# Patient Record
Sex: Female | Born: 1990 | Race: White | Marital: Single | Smoking: Never smoker
Health system: Southern US, Community
[De-identification: ages and names within clinical notes are randomized; demographics above are authoritative.]

---

## 2013-02-03 ENCOUNTER — Ambulatory Visit (INDEPENDENT_AMBULATORY_CARE_PROVIDER_SITE_OTHER): Payer: 59 | Admitting: Physician Assistant

## 2013-02-03 VITALS — BP 112/68 | HR 104 | Temp 98.0°F | Resp 16 | Ht 62.0 in | Wt 208.0 lb

## 2013-02-03 DIAGNOSIS — J069 Acute upper respiratory infection, unspecified: Secondary | ICD-10-CM

## 2013-02-03 DIAGNOSIS — J029 Acute pharyngitis, unspecified: Secondary | ICD-10-CM

## 2013-02-03 LAB — POCT RAPID STREP A (OFFICE): RAPID STREP A SCREEN: NEGATIVE

## 2013-02-03 MED ORDER — GUAIFENESIN ER 1200 MG PO TB12: 1.0000 | ORAL_TABLET | Freq: Two times a day (BID) | ORAL | Status: AC

## 2013-02-03 MED ORDER — IPRATROPIUM BROMIDE 0.06 % NA SOLN: 2.0000 | Freq: Three times a day (TID) | NASAL | Status: AC

## 2013-02-03 NOTE — Progress Notes (Signed)
   Subjective:    Patient ID: Tammy Freeman, female    DOB: 12-31-1990, 23 y.o.   MRN: [id number]  HPI Pt presents to clinic with 24h h/o cold symptoms.  She is worried about strep throat because she gets it a lot and she was exposed about 3-4 days ago.  She has terrible congestion with minimal rhinorrhea but a lot of PND.  She has a dry cough that is rare.  She is having no problems with her breathing.  OTC meds - cold preps Exposed to strep  Flu vaccine - no  Review of Systems  Constitutional: Negative for fever and chills.  HENT: Positive for congestion, postnasal drip and sore throat. Negative for rhinorrhea.   Respiratory: Positive for cough (dry).        Childhood h/o asthma now sometimes with problems with illness.  Gastrointestinal: Negative for nausea, vomiting and diarrhea.  Musculoskeletal: Negative for myalgias.  Neurological: Positive for headaches.       Objective:   Physical Exam  Vitals reviewed. Constitutional: She is oriented to person, place, and time. She appears well-developed and well-nourished.  HENT:  Head: Normocephalic and atraumatic.  Right Ear: Hearing, tympanic membrane, external ear and ear canal normal.  Left Ear: Hearing, tympanic membrane, external ear and ear canal normal.  Nose: Mucosal edema (very swollen turbinates touching her septum) present.  Mouth/Throat: Uvula is midline, oropharynx is clear and moist and mucous membranes are normal.  Eyes: Conjunctivae are normal.  Neck: Normal range of motion.  Cardiovascular: Normal rate, regular rhythm and normal heart sounds.   No murmur heard. Pulmonary/Chest: Effort normal and breath sounds normal. She has no wheezes.  Neurological: She is alert and oriented to person, place, and time.  Skin: Skin is warm and dry.  Psychiatric: She has a normal mood and affect. Her behavior is normal. Judgment and thought content normal.   Results for orders placed in visit on 02/03/13  POCT RAPID STREP A (OFFICE)       Result Value Range   Rapid Strep A Screen Negative  Negative       Assessment & Plan:  URI (upper respiratory infection) - Plan: Guaifenesin (MUCINEX MAXIMUM STRENGTH) 1200 MG TB12, ipratropium (ATROVENT) 0.06 % nasal spray  Sore throat - Plan: POCT rapid strep A  Symptomatic treatment d/w patient.    Benny LennertSarah Weber PA-C 02/03/2013 7:59 PM

## 2013-02-03 NOTE — Patient Instructions (Signed)
Please push fluids.  Tylenol and Motrin for fever and body aches.    

## 2018-10-25 NOTE — Progress Notes (Signed)
Office Visit Note  Patient: Tammy Freeman             Date of Birth: 01-Mar-1990           MRN: [id number]             PCP: Lysbeth Penner, MD Referring: Lysbeth Penner, MD Visit Date: 11/08/2018 Occupation: CNA, Environmental health practitioner  Subjective:  Lower back pain, positive ANA.   History of Present Illness: Tammy Freeman is a 28 y.o. female seen in consultation per request of her PCP.  According to patient about 6 years ago she woke up and was trying to put her pants on when she had sudden pain and numbness in her left lower extremity.  She states her symptoms persisted and she went to see her PCP who felt it was muscle spasm but the pain never did resolve.  She states she lived with those symptoms.  About 5 years ago she was seeing a back specialist in [place] who gave her back injections per patient and the relief in her symptoms for about a month.  Then about  4 years ago she went to the urgent care where she had x-rays of her lumbar spine and she was told they were normal.  She was given a steroid shots and also some medications which did not help.  She was recently seen by her PCP in August and had lab work which showed positive ANA and she was referred to me.  Patient denies discomfort in any other joints.  She states the pain is mostly in her back which makes it uncomfortable for her to sit and also she has difficulty climbing stairs and getting up from the chair.  She states the pain radiates into her bilateral hips and her left lower extremity.  Her left leg and her foot remains numb.  She denies any history of oral ulcers, nasal ulcers, malar rash, photosensitivity, chest pain palpitations, Raynaud's phenomenon.  There is no family history of autoimmune disease.  Activities of Daily Living:  Patient reports morning stiffness for 1 hour.   Patient Reports nocturnal pain.  Difficulty dressing/grooming: Denies Difficulty climbing stairs: Reports Difficulty getting out of chair: Reports  Difficulty using hands for taps, buttons, cutlery, and/or writing: Denies  Review of Systems  Constitutional: Negative for fatigue, night sweats, weight gain and weight loss.  HENT: Negative for mouth sores, trouble swallowing, trouble swallowing, mouth dryness and nose dryness.   Eyes: Negative for pain, redness, itching, visual disturbance and dryness.  Respiratory: Negative for cough, shortness of breath, wheezing and difficulty breathing.   Cardiovascular: Negative for chest pain, palpitations, hypertension, irregular heartbeat and swelling in legs/feet.  Gastrointestinal: Negative for blood in stool, constipation and diarrhea.  Endocrine: Negative for increased urination.  Genitourinary: Negative for difficulty urinating, painful urination and vaginal dryness.  Musculoskeletal: Positive for arthralgias, joint pain and morning stiffness. Negative for joint swelling, myalgias, muscle weakness, muscle tenderness and myalgias.  Skin: Negative for color change, rash, hair loss, skin tightness, ulcers and sensitivity to sunlight.  Allergic/Immunologic: Negative for susceptible to infections.  Neurological: Positive for numbness. Negative for dizziness, light-headedness, headaches, memory loss, night sweats and weakness.  Hematological: Negative for bruising/bleeding tendency and swollen glands.  Psychiatric/Behavioral: Positive for sleep disturbance. Negative for depressed mood and confusion. The patient is not nervous/anxious.     PMFS History:  There are no active problems to display for this patient.   History reviewed. No pertinent past medical history.  Family  History  Problem Relation Age of Onset  . Healthy Mother   . Hypertension Father   . Hypertension Paternal Grandmother   . Hypertension Paternal Grandfather   . Healthy Brother    History reviewed. No pertinent surgical history. Social History   Social History Narrative  . Not on file    There is no immunization  history on file for this patient.   Objective: Vital Signs: BP 113/79 (BP Location: Right Arm, Patient Position: Sitting, Cuff Size: Normal)   Pulse (!) 55   Resp 14   Ht 5' 2.25" (1.581 m)   Wt 197 lb 12.8 oz (89.7 kg)   BMI 35.89 kg/m    Physical Exam Vitals signs and nursing note reviewed.  Constitutional:      Appearance: She is well-developed.  HENT:     Head: Normocephalic and atraumatic.  Eyes:     Conjunctiva/sclera: Conjunctivae normal.  Neck:     Musculoskeletal: Normal range of motion.  Cardiovascular:     Rate and Rhythm: Normal rate and regular rhythm.     Heart sounds: Normal heart sounds.  Pulmonary:     Effort: Pulmonary effort is normal.     Breath sounds: Normal breath sounds.  Abdominal:     General: Bowel sounds are normal.     Palpations: Abdomen is soft.  Lymphadenopathy:     Cervical: No cervical adenopathy.  Skin:    General: Skin is warm and dry.     Capillary Refill: Capillary refill takes less than 2 seconds.  Neurological:     Mental Status: She is alert and oriented to person, place, and time.  Psychiatric:        Behavior: Behavior normal.      Musculoskeletal Exam: C-spine was in good range of motion.  She has difficulty with forward flexion or extension due to severe lower back pain.  She has some tenderness over SI joints.  Mild tenderness was noted over trochanteric area.  Shoulder joints, elbow joints, wrist joints, MCPs PIPs and DIPs with good range of motion with no synovitis.  Hip joints, knee joints, ankles, MTPs and PIPs with good range of motion with no synovitis.  She has no Achilles tendinitis or plantar fasciitis.  CDAI Exam: CDAI Score: - Patient Global: -; Provider Global: - Swollen: -; Tender: - Joint Exam   No joint exam has been documented for this visit   There is currently no information documented on the homunculus. Go to the Rheumatology activity and complete the homunculus joint exam.  Investigation: No  additional findings.  Imaging: Xr Lumbar Spine 2-3 Views  Result Date: 11/08/2018 No disc space narrowing was noted.  No facet joint narrowing was noted.  No syndesmophytes were noted. Impression: Unremarkable x-ray of the lumbar spine.  Xr Pelvis 1-2 Views  Result Date: 11/08/2018 No SI joint to sclerosis or narrowing was noted. Impression: Unremarkable x-ray of the SI joints.   Recent Labs: No results found for: WBC, HGB, PLT, NA, K, CL, CO2, GLUCOSE, BUN, CREATININE, BILITOT, ALKPHOS, AST, ALT, PROT, ALBUMIN, CALCIUM, GFRAA, QFTBGOLD, QFTBGOLDPLUS   09/13/18:ANA 1:40 speckled, ENA-, Anti-tpo ab-, hep panel-, HLAB27-, RF<10, CCP 4, CRP 1  Speciality Comments: No specialty comments available.  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  She has had  persistent pain since then.  She has had prednisone taper and steroid injections to her lumbar spine several years ago.  She describes the pain is constant and radiates to her left lower extremity.  She also experiences numbness in her left foot.  She has some tenderness over SI joints.  X-ray of the lumbar spine and SI joints were unremarkable today.  I will obtain MRI of her lumbar spine due to chronic pain and radiculopathy.  She is in constant pain.  Her CRP was normal and HLA-B27 was negative.  Have given her some back exercises.  I will also refer her to physical therapy.  She has been taking gabapentin which has not helped her.  Positive ANA-is borderline positive and she has no clinical features of autoimmune disease.  No further testing is needed.  Orders: Orders Placed This Encounter  Procedures  . XR Lumbar Spine 2-3 Views  . XR Pelvis 1-2 Views  . MR LUMBAR SPINE WO CONTRAST  . Ambulatory referral to  Physical Therapy   No orders of the defined types were placed in this encounter.   Face-to-face time spent with patient was 45 minutes. Greater than 50% of time was spent in counseling and coordination of care.  Follow-Up Instructions: Return for LBP.   Bo Merino, MD  Note - This record has been created using Editor, commissioning.  Chart creation errors have been sought, but may not always  have been located. Such creation errors do not reflect on  the standard of medical care.

## 2018-11-08 ENCOUNTER — Ambulatory Visit: Payer: No Typology Code available for payment source | Admitting: Rheumatology

## 2018-11-08 ENCOUNTER — Ambulatory Visit (INDEPENDENT_AMBULATORY_CARE_PROVIDER_SITE_OTHER): Payer: No Typology Code available for payment source

## 2018-11-08 ENCOUNTER — Other Ambulatory Visit: Payer: Self-pay

## 2018-11-08 ENCOUNTER — Encounter: Payer: Self-pay | Admitting: Rheumatology

## 2018-11-08 VITALS — BP 113/79 | HR 55 | Resp 14 | Ht 62.25 in | Wt 197.8 lb

## 2018-11-08 DIAGNOSIS — M5442 Lumbago with sciatica, left side: Secondary | ICD-10-CM

## 2018-11-08 DIAGNOSIS — R768 Other specified abnormal immunological findings in serum: Secondary | ICD-10-CM | POA: Diagnosis not present

## 2018-11-08 DIAGNOSIS — G8929 Other chronic pain: Secondary | ICD-10-CM

## 2018-11-08 NOTE — Patient Instructions (Signed)

## 2018-11-17 NOTE — Progress Notes (Signed)
Office Visit Note  Patient: Tammy Freeman             Date of Birth: 1990/04/26           MRN: [id number]             PCP: Lysbeth Penner, MD Referring: Lysbeth Penner, MD Visit Date: 12/01/2018 Occupation: @GUAROCC @  Subjective:  No chief complaint on file.   History of Present Illness: Tammy Freeman is a 28 y.o. female with a history of lower back pain.  She continues to have left lower extremity radiculopathy.  She states she has been experiencing numbness on the lateral side of her leg and also her left foot states completely numb.  She is unable to sit or stand for prolonged time.  Activities of Daily Living:  Patient reports morning stiffness for 1 hour.   Patient Reports nocturnal pain.  Difficulty dressing/grooming: Denies Difficulty climbing stairs: Reports Difficulty getting out of chair: Reports Difficulty using hands for taps, buttons, cutlery, and/or writing: Denies  Review of Systems  Constitutional: Negative for fatigue.  HENT: Negative for mouth sores, mouth dryness and nose dryness.   Eyes: Negative for itching and dryness.  Respiratory: Negative for shortness of breath and difficulty breathing.   Cardiovascular: Negative for chest pain and palpitations.  Gastrointestinal: Negative for abdominal pain, blood in stool, constipation and diarrhea.  Endocrine: Negative for increased urination.  Genitourinary: Negative for difficulty urinating and painful urination.  Musculoskeletal: Positive for arthralgias, joint pain and morning stiffness. Negative for joint swelling.  Skin: Negative for rash, hair loss and redness.  Allergic/Immunologic: Negative for susceptible to infections.  Neurological: Positive for numbness. Negative for dizziness, light-headedness, headaches, memory loss and weakness.  Hematological: Negative for bruising/bleeding tendency.  Psychiatric/Behavioral: Positive for sleep disturbance. Negative for depressed mood and confusion. The patient is not  nervous/anxious.     PMFS History:  There are no active problems to display for this patient.   History reviewed. No pertinent past medical history.  Family History  Problem Relation Age of Onset  . Healthy Mother   . Hypertension Father   . Hypertension Paternal Grandmother   . Hypertension Paternal Grandfather   . Healthy Brother    History reviewed. No pertinent surgical history. Social History   Social History Narrative  . Not on file    There is no immunization history on file for this patient.   Objective: Vital Signs: BP 108/70 (BP Location: Left Arm, Patient Position: Sitting, Cuff Size: Normal)   Pulse 60   Resp 12   Ht 5' 3"  (1.6 m)   Wt 199 lb 12.8 oz (90.6 kg)   BMI 35.39 kg/m    Physical Exam Vitals signs and nursing note reviewed.  Constitutional:      Appearance: She is well-developed.  HENT:     Head: Normocephalic and atraumatic.  Eyes:     Conjunctiva/sclera: Conjunctivae normal.  Neck:     Musculoskeletal: Normal range of motion.  Cardiovascular:     Rate and Rhythm: Normal rate and regular rhythm.     Heart sounds: Normal heart sounds.  Pulmonary:     Effort: Pulmonary effort is normal.     Breath sounds: Normal breath sounds.  Abdominal:     General: Bowel sounds are normal.     Palpations: Abdomen is soft.  Lymphadenopathy:     Cervical: No cervical adenopathy.  Skin:    General: Skin is warm and dry.     Capillary  Refill: Capillary refill takes less than 2 seconds.  Neurological:     Mental Status: She is alert and oriented to person, place, and time.  Psychiatric:        Behavior: Behavior normal.      Musculoskeletal Exam: C-spine was in good range of motion.  She had discomfort range of motion lumbar spine.  No SI joint tenderness was noted.  Shoulder joints, elbow joints, wrist joints, MCPs PIPs and DIPs with good range of motion with no synovitis.  Hip joints, knee joints, ankles, MTPs and PIPs with good range of motion with  no synovitis.  Straight leg raise test was positive on the left side. CDAI Exam: CDAI Score: - Patient Global: -; Provider Global: - Swollen: -; Tender: - Joint Exam   No joint exam has been documented for this visit   There is currently no information documented on the homunculus. Go to the Rheumatology activity and complete the homunculus joint exam.  Investigation: No additional findings.  Imaging: Mr Lumbar Spine Wo Contrast  Result Date: 11/30/2018 CLINICAL DATA:  Radiculopathy, left hip pain for 6 years left leg numbness EXAM: MRI LUMBAR SPINE WITHOUT CONTRAST TECHNIQUE: Multiplanar, multisequence MR imaging of the lumbar spine was performed. No intravenous contrast was administered. COMPARISON:  None. FINDINGS: Segmentation: There are 5 non-rib bearing lumbar type vertebral bodies with the last intervertebral disc space labeled as L5-S1. Alignment:  Normal Vertebrae: The vertebral body heights are well maintained. No fracture, marrow edema,or pathologic marrow infiltration. Conus medullaris and cauda equina: Conus extends to the L1 level. Conus and cauda equina appear normal. Paraspinal and other soft tissues: The paraspinal soft tissues and visualized retroperitoneal structures are unremarkable. The sacroiliac joints are intact. Disc levels: T12-L1:  No significant canal or neural foraminal narrowing. L1-L2:   No significant canal or neural foraminal narrowing. L2-L3:   No significant canal or neural foraminal narrowing. L3-L4:   No significant canal or neural foraminal narrowing. L4-L5: There is a minimal broad-based disc bulge, however no significant canal or neural foraminal narrowing. L5-S1: There is a broad-based disc bulge with a central disc protrusion which contacts the descending left S1 nerve root without impingement. There is mild bilateral neural foraminal narrowing. There is mild effacement anterior thecal sac. IMPRESSION: Mild lower lumbar spine spondylosis most notable at  L5-S1 with a small central disc protrusion which contacts the descending left S1 nerve root without impingement. There is mild bilateral neural foraminal narrowing. Electronically Signed   By: Prudencio Pair M.D.   On: 11/30/2018 01:45   Xr Lumbar Spine 2-3 Views  Result Date: 11/08/2018 No disc space narrowing was noted.  No facet joint narrowing was noted.  No syndesmophytes were noted. Impression: Unremarkable x-ray of the lumbar spine.  Xr Pelvis 1-2 Views  Result Date: 11/08/2018 No SI joint to sclerosis or narrowing was noted. Impression: Unremarkable x-ray of the SI joints.   Recent Labs: No results found for: WBC, HGB, PLT, NA, K, CL, CO2, GLUCOSE, BUN, CREATININE, BILITOT, ALKPHOS, AST, ALT, PROT, ALBUMIN, CALCIUM, GFRAA, QFTBGOLD, QFTBGOLDPLUS  Speciality Comments: No specialty comments available.  Procedures:  No procedures performed Allergies: Penicillins   Assessment / Plan:     Visit Diagnoses: Chronic midline low back pain with left-sided sciatica - CRP normal, HLA-B27 negative.  Patient has L5-S1 disc space narrowing and broad-based central disc bulge encroaching S1 nerve root without impingement.  She has some facet joint arthropathy.  I detailed discussion regarding the MRI findings with the patient.  She has not started physical therapy yet.  She states she has constant pain while sitting or standing.  She has constant numbness in her left foot.  I will refer her to Dr. Ernestina Patches for evaluation and possible injection.  Some of the exercises were demonstrated in the office today.  I have also given her a handout on back exercises.  Maintaining good posture was emphasized.  Sleeping position was also discussed.  Positive ANA (antinuclear antibody)-at this time she does not have any clinical features of autoimmune disease.  If she develops any new symptoms she should contact me.  Orders: No orders of the defined types were placed in this encounter.  No orders of the defined  types were placed in this encounter.     Follow-Up Instructions: Return if symptoms worsen or fail to improve, for DDD.   Bo Merino, MD  Note - This record has been created using Editor, commissioning.  Chart creation errors have been sought, but may not always  have been located. Such creation errors do not reflect on  the standard of medical care.

## 2018-11-18 ENCOUNTER — Telehealth: Payer: Self-pay | Admitting: Rheumatology

## 2018-11-18 NOTE — Telephone Encounter (Signed)
Patient does not have coverage for an MRI thru her insurance. Patient has chose to proceed with MRI at [facility] as self pay. Patient was wondering if doctor wanted her to still be referred to Physical Therapy since she will not have to do that to proceed with MRI? Please call to advise.

## 2018-11-21 NOTE — Telephone Encounter (Signed)
Patient advised we can decide on physical therapy after the MRI results are available. Patient states she has scheduled her MRI 11/29/18. Patient states she is claustrophobic and is requesting a one time dose of medication to take prior to MRI.

## 2018-11-21 NOTE — Telephone Encounter (Signed)
We can decide on physical therapy after the MRI results are available.

## 2018-11-21 NOTE — Telephone Encounter (Signed)
Ok to send in a prescription for Valium 5 mg 1 tablet by mouth prior to MRI Dispense 1 tablet, zero refills

## 2018-11-22 MED ORDER — DIAZEPAM 5 MG PO TABS: ORAL_TABLET | ORAL | 0 refills | Status: AC

## 2018-11-22 NOTE — Telephone Encounter (Signed)
Patient advised prescription for Valium to be sent to the pharmacy.

## 2018-11-29 ENCOUNTER — Ambulatory Visit (HOSPITAL_COMMUNITY)
Admission: RE | Admit: 2018-11-29 | Discharge: 2018-11-29 | Disposition: A | Discharge status: Home / Self Care | Payer: Self-pay | Source: Ambulatory Visit | Attending: Rheumatology | Admitting: Rheumatology

## 2018-11-29 ENCOUNTER — Telehealth: Payer: Self-pay | Admitting: Rheumatology

## 2018-11-29 ENCOUNTER — Other Ambulatory Visit: Payer: Self-pay

## 2018-11-29 DIAGNOSIS — G8929 Other chronic pain: Secondary | ICD-10-CM | POA: Insufficient documentation

## 2018-11-29 DIAGNOSIS — M5442 Lumbago with sciatica, left side: Secondary | ICD-10-CM | POA: Insufficient documentation

## 2018-11-29 NOTE — Telephone Encounter (Signed)
Patient called stating she went [facility] at [address] in [place] and was told they haven't received the prescription Dr. Estanislado Pandy prescribed for her to take before her MRI today.  Patient is requesting a return call ASAP.

## 2018-11-29 NOTE — Telephone Encounter (Signed)
Called the pharmacy and gave verbal for Valium. Contacted patient to advise.

## 2018-11-30 ENCOUNTER — Telehealth: Payer: Self-pay | Admitting: *Deleted

## 2018-11-30 DIAGNOSIS — M5442 Lumbago with sciatica, left side: Secondary | ICD-10-CM

## 2018-11-30 DIAGNOSIS — G8929 Other chronic pain: Secondary | ICD-10-CM

## 2018-11-30 NOTE — Progress Notes (Signed)
The MRI of the lumbar spine shows mild spondylosis and mild foraminal narrowing.  If he is having radiculopathy we can refer him to Dr. Ernestina Patches for evaluation.

## 2018-11-30 NOTE — Telephone Encounter (Signed)
-----   Message from Bo Merino, MD sent at 11/30/2018  8:26 AM EST ----- The MRI of the lumbar spine shows mild spondylosis and mild foraminal narrowing.  If he is having radiculopathy we can refer him to Dr. Ernestina Patches for evaluation.

## 2018-12-01 ENCOUNTER — Other Ambulatory Visit: Payer: Self-pay

## 2018-12-01 ENCOUNTER — Encounter: Payer: Self-pay | Admitting: Rheumatology

## 2018-12-01 ENCOUNTER — Ambulatory Visit: Payer: No Typology Code available for payment source | Admitting: Rheumatology

## 2018-12-01 VITALS — BP 108/70 | HR 60 | Resp 12 | Ht 63.0 in | Wt 199.8 lb

## 2018-12-01 DIAGNOSIS — M5442 Lumbago with sciatica, left side: Secondary | ICD-10-CM

## 2018-12-01 DIAGNOSIS — G8929 Other chronic pain: Secondary | ICD-10-CM | POA: Diagnosis not present

## 2018-12-01 DIAGNOSIS — R768 Other specified abnormal immunological findings in serum: Secondary | ICD-10-CM

## 2018-12-01 NOTE — Patient Instructions (Signed)

## 2018-12-08 ENCOUNTER — Other Ambulatory Visit: Payer: Self-pay

## 2018-12-08 ENCOUNTER — Ambulatory Visit (INDEPENDENT_AMBULATORY_CARE_PROVIDER_SITE_OTHER): Payer: No Typology Code available for payment source | Admitting: Physical Medicine and Rehabilitation

## 2018-12-08 ENCOUNTER — Encounter: Payer: Self-pay | Admitting: Physical Medicine and Rehabilitation

## 2018-12-08 ENCOUNTER — Ambulatory Visit: Payer: Self-pay

## 2018-12-08 VITALS — BP 127/76 | HR 60

## 2018-12-08 DIAGNOSIS — G8929 Other chronic pain: Secondary | ICD-10-CM

## 2018-12-08 DIAGNOSIS — M5116 Intervertebral disc disorders with radiculopathy, lumbar region: Secondary | ICD-10-CM | POA: Diagnosis not present

## 2018-12-08 DIAGNOSIS — M5442 Lumbago with sciatica, left side: Secondary | ICD-10-CM | POA: Diagnosis not present

## 2018-12-08 DIAGNOSIS — G894 Chronic pain syndrome: Secondary | ICD-10-CM | POA: Diagnosis not present

## 2018-12-08 DIAGNOSIS — M5416 Radiculopathy, lumbar region: Secondary | ICD-10-CM

## 2018-12-08 MED ORDER — BETAMETHASONE SOD PHOS & ACET 6 (3-3) MG/ML IJ SUSP
12.0000 mg | Freq: Once | INTRAMUSCULAR | Status: AC
  Administered 2018-12-08: 12 mg

## 2018-12-08 NOTE — Procedures (Signed)
S1 Lumbosacral Transforaminal Epidural Steroid Injection - Sub-Pedicular Approach with Fluoroscopic Guidance   Patient: Tammy Freeman      Date of Birth: [birth year] MRN: [id number] PCP: Lysbeth Penner, MD      Visit Date: 12/08/2018   Universal Protocol:    Date/Time: 12/08/2010:31 PM  Consent Given By: the patient  Position:  PRONE  Additional Comments: Vital signs were monitored before and after the procedure. Patient was prepped and draped in the usual sterile fashion. The correct patient, procedure, and site was verified.   Injection Procedure Details:  Procedure Site One Meds Administered:  Meds ordered this encounter  Medications  . betamethasone acetate-betamethasone sodium phosphate (CELESTONE) injection 12 mg    Laterality: Left  Location/Site: S1 is transitional with left-sided more sacralized on right side lumbarized. S1 Foramen   Needle size: 22 ga.  Needle type: Spinal  Needle Placement: Transforaminal  Findings:   -Comments: Excellent flow of contrast along the nerve and into the epidural space.  Epidurogram: Contrast epidurogram showed no nerve root cut off or restricted flow pattern.   Procedure Details: After squaring off the sacral end-plate to get a true AP view, the C-arm was positioned so that the best possible view of the S1 foramen was visualized. The soft tissues overlying this structure were infiltrated with 2-3 ml. of 1% Lidocaine without Epinephrine.    The spinal needle was inserted toward the target using a "trajectory" view along the fluoroscope beam.  Under AP and lateral visualization, the needle was advanced so it did not puncture dura. Biplanar projections were used to confirm position. Aspiration was confirmed to be negative for CSF and/or blood. A 1-2 ml. volume of Isovue-250 was injected and flow of contrast was noted at each level. Radiographs were obtained for documentation purposes.   After attaining the desired flow of contrast  documented above, a 0.5 to 1.0 ml test dose of 0.25% Marcaine was injected into each respective transforaminal space.  The patient was observed for 90 seconds post injection.  After no sensory deficits were reported, and normal lower extremity motor function was noted,   the above injectate was administered so that equal amounts of the injectate were placed at each foramen (level) into the transforaminal epidural space.   Additional Comments:  The patient tolerated the procedure well Dressing: Band-Aid with 2 x 2 sterile gauze    Post-procedure details: Patient was observed during the procedure. Post-procedure instructions were reviewed.  Patient left the clinic in stable condition.

## 2018-12-08 NOTE — Progress Notes (Signed)
Tammy Freeman - 28 y.o. female MRN [id number]  Date of birth: 11/17/1990  Office Visit Note: Visit Date: 12/08/2018 PCP: Barbette Reichmann, MD Referred by: Barbette Reichmann, MD  Subjective: Chief Complaint  Patient presents with  . Lower Back - Pain  . Left Leg - Pain  . Left Foot - Pain   HPI: Tammy Freeman is a 28 y.o. female who comes in today At the request of Mal Misty for new patient consultation for chronic worsening low back pain with left hip and leg pain with paresthesia.  Patient reports that 6 or 7 years ago she actually woke up 1 morning and was putting her pants on and had severe pain down the leg with paresthesia and numbness.  She reports it really never has gotten any better over the years in terms of the numbness.  She has felt weak but no focal weakness.  She is our primary care physician at the time she has had medication treatments with some relief over time of the severe pain but that will flareup.  More recently she was referred to Dr. Corliss Skains for rheumatologic work-up and that still ongoing.  MRI of the lumbar spine has been performed.  This is reviewed below with the patient with spine models.  She has a transitional S1 segment with very small central protrusion at L5-S1 with what almost appears to be some level of change in the epidural tissue on the left side whether this is scar tissue or not.  She has had no prior lumbar surgery.  She has some early arthritic change in this area as well as mild.  She reports 8 out of 10 pain in light of worsening over the last 2 years.  She is worse with standing but also sitting.  She is really just miserable at this point.  She has not consulted a spine surgeon.  She has had no prior electrodiagnostic studies.  She was tried on gabapentin which she does take at night only.  She has had no other red flag complaints.  Review of Systems  Constitutional: Negative for chills, fever, malaise/fatigue and weight loss.  HENT: Negative for  hearing loss and sinus pain.   Eyes: Negative for blurred vision, double vision and photophobia.  Respiratory: Negative for cough and shortness of breath.   Cardiovascular: Negative for chest pain, palpitations and leg swelling.  Gastrointestinal: Negative for abdominal pain, nausea and vomiting.  Genitourinary: Negative for flank pain.  Musculoskeletal: Positive for back pain. Negative for myalgias.  Skin: Negative for itching and rash.  Neurological: Positive for tingling. Negative for tremors, focal weakness and weakness.  Endo/Heme/Allergies: Negative.   Psychiatric/Behavioral: Negative for depression.  All other systems reviewed and are negative.  Otherwise per HPI.  Assessment & Plan: Visit Diagnoses:  1. Lumbar radiculopathy   2. Radiculopathy due to lumbar intervertebral disc disorder   3. Chronic bilateral low back pain with left-sided sciatica   4. Chronic pain syndrome     Plan: Findings:  Chronic history of low back pain and left radicular type leg pain and a pretty classic L5 more than S1 distribution.  She does have a transitional segment which really shows the left-sided S1 segment is more sacralized and the right-sided S1 segment is more lumbarized.  Given giving that numbering scheme even though we say the little small disc could be near the S1 nerve root it might be actually her L5 nerve root.  Regardless there is some changes there.  This  could have been worse looking from a spine standpoint when it first happened.  I do think it probably is coming from the spine.  Other avenues of issues that would be warranted would be something like multiple sclerosis or something of that effect.  She has not had MRI of the brain that I am aware of her any work-up from that standpoint.  She has had prior injections from a spine specialist or maybe [facility] radiology she did not remember.  Those injections were temporarily beneficial in the past.  Today we completed a diagnostic left S1  transforaminal injection based on her numbering scheme and will see how she does.  We will consider medication change versus electrodiagnostic study.  If electrodiagnostic studies performed I probably have that done by a neurologist just to see how that looks.  Otherwise she will continue to follow-up with Dr. Estanislado Pandy for rheumatology aspect of this.    Meds & Orders:  Meds ordered this encounter  Medications  . betamethasone acetate-betamethasone sodium phosphate (CELESTONE) injection 12 mg    Orders Placed This Encounter  Procedures  . XR C-ARM NO REPORT  . Epidural Steroid injection    Follow-up: Return if symptoms worsen or fail to improve.   Procedures: No procedures performed  S1 Lumbosacral Transforaminal Epidural Steroid Injection - Sub-Pedicular Approach with Fluoroscopic Guidance   Patient: Tammy Freeman      Date of Birth: 06-14-1990 MRN: [id number] PCP: Lysbeth Penner, MD      Visit Date: 12/08/2018   Universal Protocol:    Date/Time: 12/08/2010:31 PM  Consent Given By: the patient  Position:  PRONE  Additional Comments: Vital signs were monitored before and after the procedure. Patient was prepped and draped in the usual sterile fashion. The correct patient, procedure, and site was verified.   Injection Procedure Details:  Procedure Site One Meds Administered:  Meds ordered this encounter  Medications  . betamethasone acetate-betamethasone sodium phosphate (CELESTONE) injection 12 mg    Laterality: Left  Location/Site: S1 is transitional with left-sided more sacralized on right side lumbarized. S1 Foramen   Needle size: 22 ga.  Needle type: Spinal  Needle Placement: Transforaminal  Findings:   -Comments: Excellent flow of contrast along the nerve and into the epidural space.  Epidurogram: Contrast epidurogram showed no nerve root cut off or restricted flow pattern.   Procedure Details: After squaring off the sacral end-plate to get a true AP  view, the C-arm was positioned so that the best possible view of the S1 foramen was visualized. The soft tissues overlying this structure were infiltrated with 2-3 ml. of 1% Lidocaine without Epinephrine.    The spinal needle was inserted toward the target using a "trajectory" view along the fluoroscope beam.  Under AP and lateral visualization, the needle was advanced so it did not puncture dura. Biplanar projections were used to confirm position. Aspiration was confirmed to be negative for CSF and/or blood. A 1-2 ml. volume of Isovue-250 was injected and flow of contrast was noted at each level. Radiographs were obtained for documentation purposes.   After attaining the desired flow of contrast documented above, a 0.5 to 1.0 ml test dose of 0.25% Marcaine was injected into each respective transforaminal space.  The patient was observed for 90 seconds post injection.  After no sensory deficits were reported, and normal lower extremity motor function was noted,   the above injectate was administered so that equal amounts of the injectate were placed at each foramen (level) into  the transforaminal epidural space.   Additional Comments:  The patient tolerated the procedure well Dressing: Band-Aid with 2 x 2 sterile gauze    Post-procedure details: Patient was observed during the procedure. Post-procedure instructions were reviewed.  Patient left the clinic in stable condition.    Clinical History: MRI LUMBAR SPINE WITHOUT CONTRAST  TECHNIQUE: Multiplanar, multisequence MR imaging of the lumbar spine was performed. No intravenous contrast was administered.  COMPARISON:  None.  FINDINGS: Segmentation: There are 5 non-rib bearing lumbar type vertebral bodies with the last intervertebral disc space labeled as L5-S1.  Alignment:  Normal  Vertebrae: The vertebral body heights are well maintained. No fracture, marrow edema,or pathologic marrow infiltration.  Conus medullaris and  cauda equina: Conus extends to the L1 level. Conus and cauda equina appear normal.  Paraspinal and other soft tissues: The paraspinal soft tissues and visualized retroperitoneal structures are unremarkable. The sacroiliac joints are intact.  Disc levels:  T12-L1:  No significant canal or neural foraminal narrowing.  L1-L2:   No significant canal or neural foraminal narrowing.  L2-L3:   No significant canal or neural foraminal narrowing.  L3-L4:   No significant canal or neural foraminal narrowing.  L4-L5: There is a minimal broad-based disc bulge, however no significant canal or neural foraminal narrowing.  L5-S1: There is a broad-based disc bulge with a central disc protrusion which contacts the descending left S1 nerve root without impingement. There is mild bilateral neural foraminal narrowing. There is mild effacement anterior thecal sac.  IMPRESSION: Mild lower lumbar spine spondylosis most notable at L5-S1 with a small central disc protrusion which contacts the descending left S1 nerve root without impingement. There is mild bilateral neural foraminal narrowing.   Electronically Signed   By: Jonna Clark M.D.   On: 11/30/2018 01:45   She reports that she has never smoked. She has never used smokeless tobacco. No results for input(s): HGBA1C, LABURIC in the last 8760 hours.  Objective:  VS:  HT:    WT:   BMI:     BP:127/76  HR:60bpm  TEMP: ( )  RESP:  Physical Exam Vitals signs and nursing note reviewed.  Constitutional:      General: She is not in acute distress.    Appearance: Normal appearance. She is well-developed.  HENT:     Head: Normocephalic and atraumatic.     Nose: Nose normal.     Mouth/Throat:     Mouth: Mucous membranes are moist.     Pharynx: Oropharynx is clear.  Eyes:     Conjunctiva/sclera: Conjunctivae normal.     Pupils: Pupils are equal, round, and reactive to light.  Neck:     Musculoskeletal: Normal range of motion and  neck supple.  Cardiovascular:     Rate and Rhythm: Regular rhythm.  Pulmonary:     Effort: Pulmonary effort is normal. No respiratory distress.  Abdominal:     General: There is no distension.     Palpations: Abdomen is soft.     Tenderness: There is no guarding.  Musculoskeletal:     Right lower leg: No edema.     Left lower leg: No edema.     Comments: Patient stands and ambulates without aid.  She has a positive slump test on the left.  She has good distal strength without clonus.  She has some subjective L5 dermatomal dysesthesia.  Skin:    General: Skin is warm and dry.     Findings: No erythema or rash.  Neurological:  General: No focal deficit present.     Mental Status: She is alert and oriented to person, place, and time.     Motor: No abnormal muscle tone.     Coordination: Coordination normal.     Gait: Gait normal.  Psychiatric:        Mood and Affect: Mood normal.        Behavior: Behavior normal.        Thought Content: Thought content normal.     Ortho Exam Imaging: Xr C-arm No Report  Result Date: 12/08/2018 Please see Notes tab for imaging impression.   Past Medical/Family/Surgical/Social History: Medications & Allergies reviewed per EMR, new medications updated. Patient Active Problem List   Diagnosis Date Noted  . Radiculopathy due to lumbar intervertebral disc disorder 12/08/2018  . Chronic pain syndrome 12/08/2018   History reviewed. No pertinent past medical history. Family History  Problem Relation Age of Onset  . Healthy Mother   . Hypertension Father   . Hypertension Paternal Grandmother   . Hypertension Paternal Grandfather   . Healthy Brother    History reviewed. No pertinent surgical history. Social History   Occupational History  . Not on file  Tobacco Use  . Smoking status: Never Smoker  . Smokeless tobacco: Never Used  Substance and Sexual Activity  . Alcohol use: Not Currently  . Drug use: Never  . Sexual activity: Not  on file

## 2018-12-08 NOTE — Progress Notes (Signed)
 .  Numeric Pain Rating Scale and Functional Assessment Average Pain 8   In the last MONTH (on 0-10 scale) has pain interfered with the following?  1. General activity like being  able to carry out your everyday physical activities such as walking, climbing stairs, carrying groceries, or moving a chair?  Rating(8.5)   +Driver, -BT, -Dye Allergies.

## 2018-12-12 ENCOUNTER — Telehealth: Payer: Self-pay | Admitting: Physical Medicine and Rehabilitation

## 2018-12-12 NOTE — Telephone Encounter (Signed)
I reviewed the images and this was a left S1 transforaminal and a transitional segment so it looks like an L5 transforaminal injection.  It transforaminal approach is really unable to cause a dural puncture and there were no signs on the imaging and contrast of the dural puncture.  I think the headache is steroid induced.  This should go away on its own until that time continue with fluids and Tylenol and/or ibuprofen if she can take it 800 mg 3 times a day if needed.  Would use heat and ice as needed but this should resolve.  Again there is no evidence of any dural puncture or dural type process.

## 2018-12-12 NOTE — Telephone Encounter (Signed)
Called patient to advise  °

## 2020-05-22 IMAGING — MR MR LUMBAR SPINE W/O CM
4 of 5 series · 19 of 48 positions shown · non-contrast
Comparison: None.

CLINICAL DATA: Radiculopathy, left hip pain for 6 years left leg
numbness

EXAM:
MRI LUMBAR SPINE WITHOUT CONTRAST
TECHNIQUE: Multiplanar, multisequence MR imaging of the lumbar spine was
performed. No intravenous contrast was administered.

[Series 2: T2 · sagittal · 4.0mm · 0.53mm/px · 6 of 15 slices shown (1 of 2)]
[im 1/15]
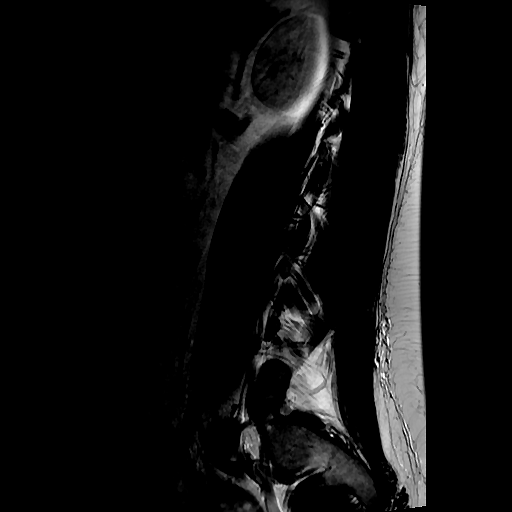
[im 3/15]
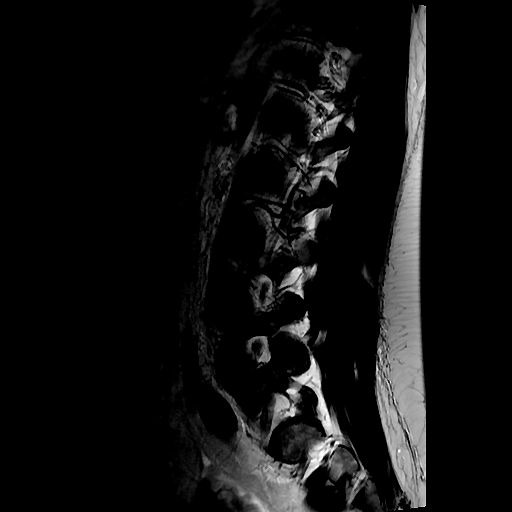
[im 6/15]
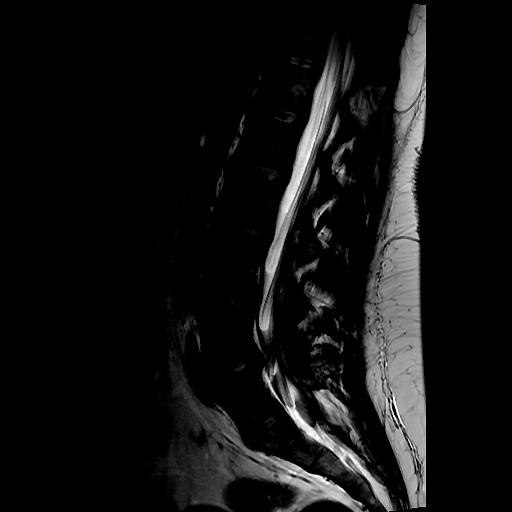
[im 9/15]
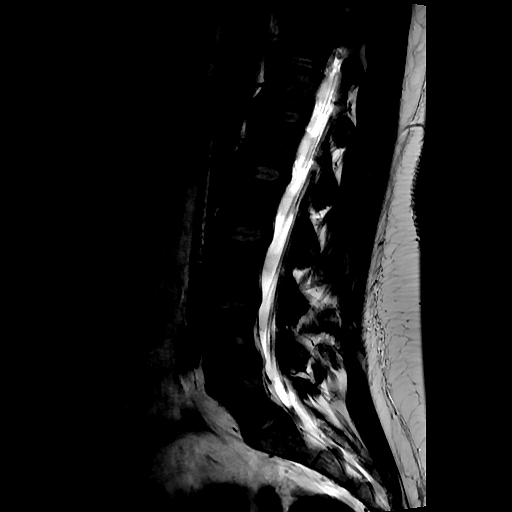
[im 12/15]
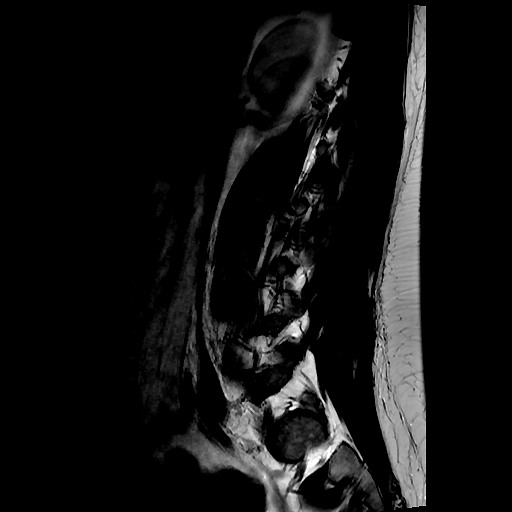
[im 15/15]
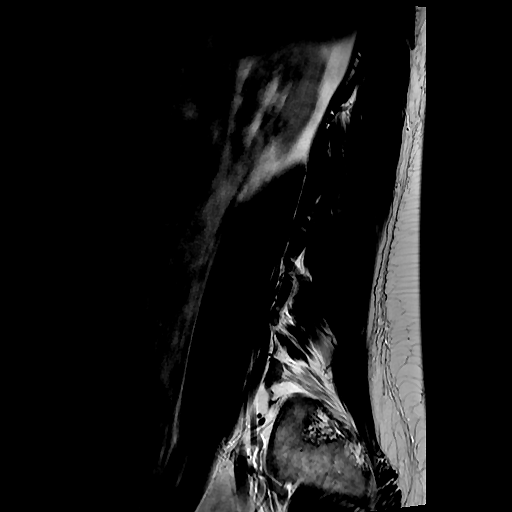

[Series 4: T1 · sagittal · 4.0mm · 0.53mm/px · 3 of 15 slices shown (1 of 2)]
[im 3/15]
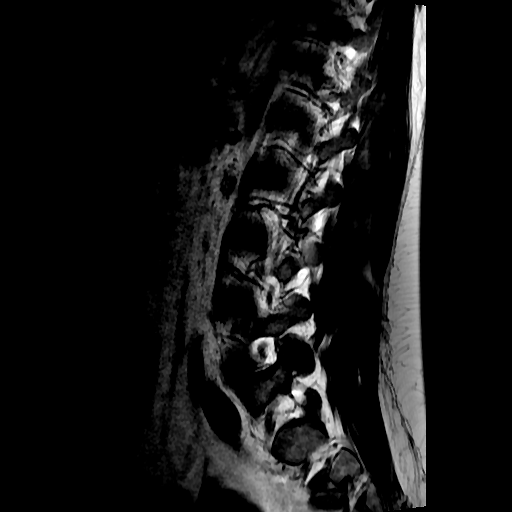
[im 8/15]
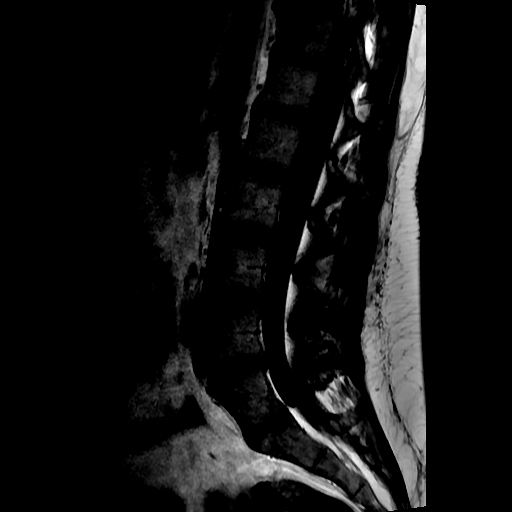
[im 12/15]
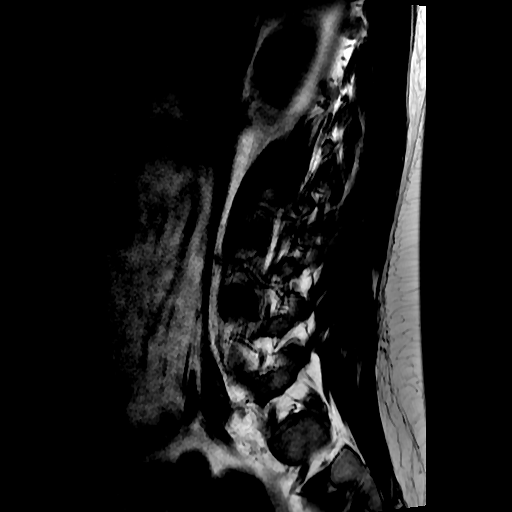

[Series 5: T2 · axial · 4.0mm · 0.39mm/px · z∈[-159,-3]mm · 7 of 32 slices shown (2 of 2)]
[im 1/32]
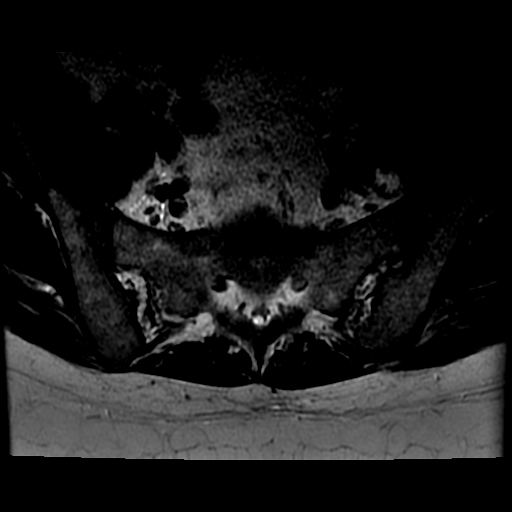
[im 5/32]
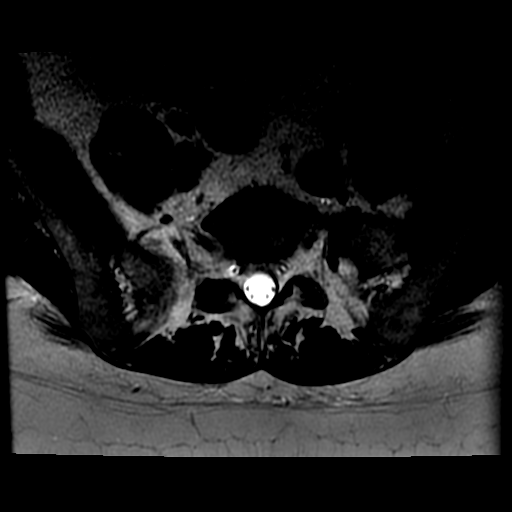
[im 10/32]
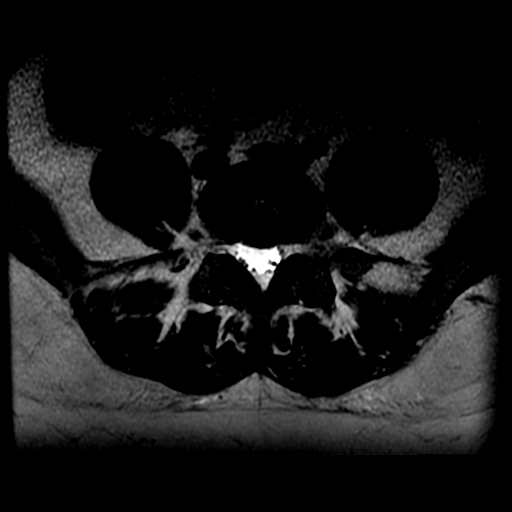
[im 15/32]
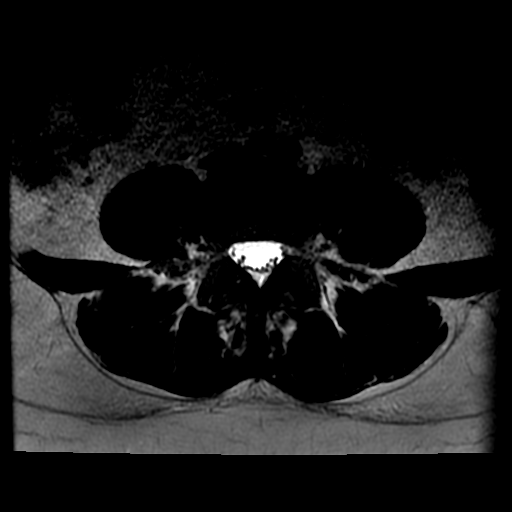
[im 17/32]
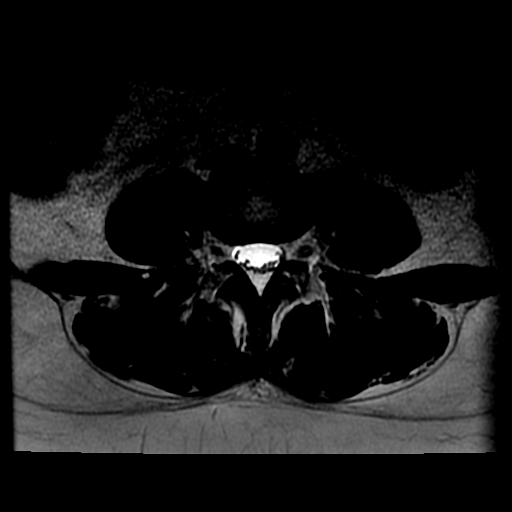
[im 22/32]
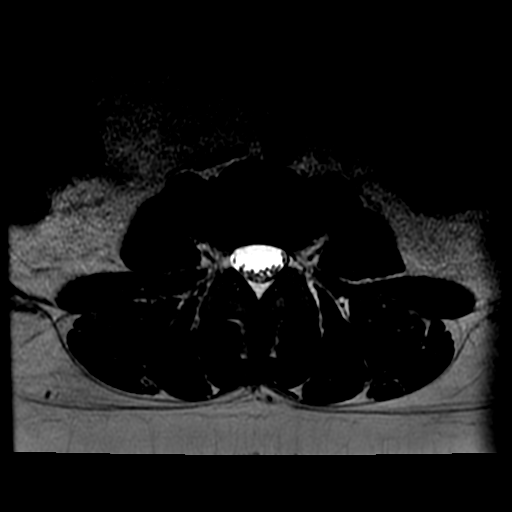
[im 27/32]
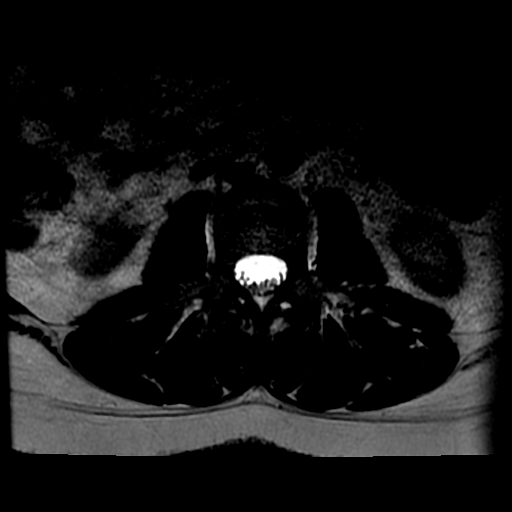

[Series 6: T1 · axial · 4.0mm · 0.39mm/px · z∈[-139,-3]mm · 3 of 32 slices shown (2 of 2)]
[im 5/32]
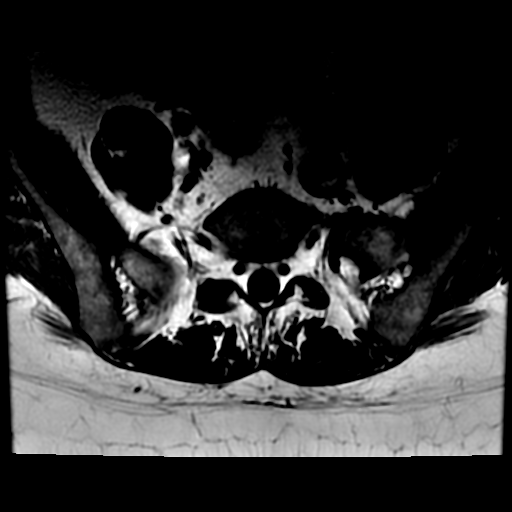
[im 17/32]
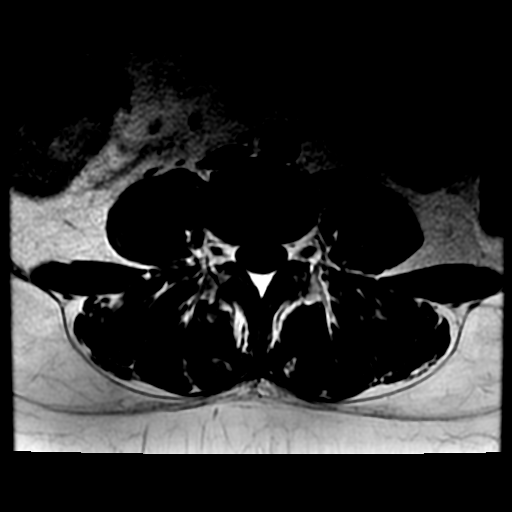
[im 27/32]
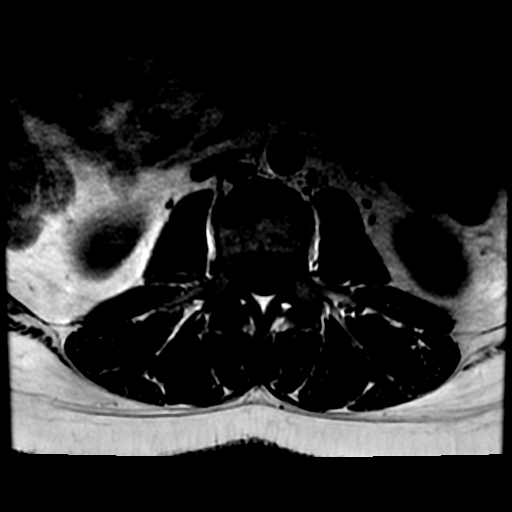

[19 of 48 positions shown; findings below may reference images not displayed]

FINDINGS: Segmentation: There are 5 non-rib bearing lumbar type vertebral
bodies with the last intervertebral disc space labeled as L5-S1.

Alignment:  Normal

Vertebrae: The vertebral body heights are well maintained. No
fracture, marrow edema,or pathologic marrow infiltration.

Conus medullaris and cauda equina: Conus extends to the L1 level.
Conus and cauda equina appear normal.

Paraspinal and other soft tissues: The paraspinal soft tissues and
visualized retroperitoneal structures are unremarkable. The
sacroiliac joints are intact.

Disc levels:

T12-L1:  No significant canal or neural foraminal narrowing.

L1-L2:   No significant canal or neural foraminal narrowing.

L2-L3:   No significant canal or neural foraminal narrowing.

L3-L4:   No significant canal or neural foraminal narrowing.

L4-L5: There is a minimal broad-based disc bulge, however no
significant canal or neural foraminal narrowing.

L5-S1: There is a broad-based disc bulge with a central disc
protrusion which contacts the descending left S1 nerve root without
impingement. There is mild bilateral neural foraminal narrowing.
There is mild effacement anterior thecal sac.
IMPRESSION: Mild lower lumbar spine spondylosis most notable at L5-S1 with a
small central disc protrusion which contacts the descending left S1
nerve root without impingement. There is mild bilateral neural
foraminal narrowing.
